# Patient Record
Sex: Male | Born: 2000
Health system: Southern US, Community
[De-identification: ages and names within clinical notes are randomized; demographics above are authoritative.]

## PROBLEM LIST (undated history)

## (undated) DIAGNOSIS — H4031X0 Glaucoma secondary to eye trauma, right eye, stage unspecified: Secondary | ICD-10-CM

## (undated) DIAGNOSIS — S52502A Unspecified fracture of the lower end of left radius, initial encounter for closed fracture: Secondary | ICD-10-CM

## (undated) HISTORY — PX: STRABISMUS SURGERY: SHX218

---
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
## 2018-03-10 DIAGNOSIS — D2262 Melanocytic nevi of left upper limb, including shoulder: Secondary | ICD-10-CM | POA: Diagnosis not present

## 2018-03-10 DIAGNOSIS — L700 Acne vulgaris: Secondary | ICD-10-CM | POA: Diagnosis not present

## 2018-03-10 DIAGNOSIS — D485 Neoplasm of uncertain behavior of skin: Secondary | ICD-10-CM | POA: Diagnosis not present

## 2018-03-10 DIAGNOSIS — L708 Other acne: Secondary | ICD-10-CM | POA: Diagnosis not present

## 2018-03-10 DIAGNOSIS — D225 Melanocytic nevi of trunk: Secondary | ICD-10-CM | POA: Diagnosis not present

## 2018-03-28 DIAGNOSIS — D229 Melanocytic nevi, unspecified: Secondary | ICD-10-CM | POA: Diagnosis not present

## 2018-04-09 DIAGNOSIS — F17200 Nicotine dependence, unspecified, uncomplicated: Secondary | ICD-10-CM | POA: Diagnosis not present

## 2018-04-09 DIAGNOSIS — F410 Panic disorder [episodic paroxysmal anxiety] without agoraphobia: Secondary | ICD-10-CM | POA: Diagnosis not present

## 2018-04-09 DIAGNOSIS — R079 Chest pain, unspecified: Secondary | ICD-10-CM | POA: Diagnosis not present

## 2018-04-09 DIAGNOSIS — F409 Phobic anxiety disorder, unspecified: Secondary | ICD-10-CM | POA: Diagnosis not present

## 2018-04-26 DIAGNOSIS — D225 Melanocytic nevi of trunk: Secondary | ICD-10-CM | POA: Diagnosis not present

## 2018-04-26 DIAGNOSIS — L988 Other specified disorders of the skin and subcutaneous tissue: Secondary | ICD-10-CM | POA: Diagnosis not present

## 2018-05-04 DIAGNOSIS — R319 Hematuria, unspecified: Secondary | ICD-10-CM | POA: Diagnosis not present

## 2018-05-04 DIAGNOSIS — F17200 Nicotine dependence, unspecified, uncomplicated: Secondary | ICD-10-CM | POA: Diagnosis not present

## 2018-05-04 DIAGNOSIS — R410 Disorientation, unspecified: Secondary | ICD-10-CM | POA: Diagnosis not present

## 2018-05-04 DIAGNOSIS — R945 Abnormal results of liver function studies: Secondary | ICD-10-CM | POA: Diagnosis not present

## 2018-05-04 DIAGNOSIS — Z79899 Other long term (current) drug therapy: Secondary | ICD-10-CM | POA: Diagnosis not present

## 2018-05-04 DIAGNOSIS — R51 Headache: Secondary | ICD-10-CM | POA: Diagnosis not present

## 2018-05-04 DIAGNOSIS — B2790 Infectious mononucleosis, unspecified without complication: Secondary | ICD-10-CM | POA: Diagnosis not present

## 2018-05-10 DIAGNOSIS — B2790 Infectious mononucleosis, unspecified without complication: Secondary | ICD-10-CM | POA: Diagnosis not present

## 2018-06-21 DIAGNOSIS — Q131 Absence of iris: Secondary | ICD-10-CM | POA: Diagnosis not present

## 2018-06-21 DIAGNOSIS — H4031X4 Glaucoma secondary to eye trauma, right eye, indeterminate stage: Secondary | ICD-10-CM | POA: Diagnosis not present

## 2018-06-21 DIAGNOSIS — Z961 Presence of intraocular lens: Secondary | ICD-10-CM | POA: Diagnosis not present

## 2018-06-27 DIAGNOSIS — S6991XA Unspecified injury of right wrist, hand and finger(s), initial encounter: Secondary | ICD-10-CM | POA: Diagnosis not present

## 2018-06-29 DIAGNOSIS — S62114A Nondisplaced fracture of triquetrum [cuneiform] bone, right wrist, initial encounter for closed fracture: Secondary | ICD-10-CM | POA: Diagnosis not present

## 2018-06-29 DIAGNOSIS — M25531 Pain in right wrist: Secondary | ICD-10-CM | POA: Diagnosis not present

## 2018-06-30 DIAGNOSIS — F330 Major depressive disorder, recurrent, mild: Secondary | ICD-10-CM | POA: Diagnosis not present

## 2018-07-20 DIAGNOSIS — Z4802 Encounter for removal of sutures: Secondary | ICD-10-CM | POA: Diagnosis not present

## 2018-08-01 DIAGNOSIS — S62111A Displaced fracture of triquetrum [cuneiform] bone, right wrist, initial encounter for closed fracture: Secondary | ICD-10-CM | POA: Diagnosis not present

## 2018-08-01 DIAGNOSIS — H4089 Other specified glaucoma: Secondary | ICD-10-CM | POA: Diagnosis not present

## 2018-08-01 DIAGNOSIS — M25531 Pain in right wrist: Secondary | ICD-10-CM | POA: Diagnosis not present

## 2018-08-18 DIAGNOSIS — R1011 Right upper quadrant pain: Secondary | ICD-10-CM | POA: Diagnosis not present

## 2018-09-19 DIAGNOSIS — Z6820 Body mass index (BMI) 20.0-20.9, adult: Secondary | ICD-10-CM | POA: Diagnosis not present

## 2018-09-19 DIAGNOSIS — R140 Abdominal distension (gaseous): Secondary | ICD-10-CM | POA: Diagnosis not present

## 2018-09-19 DIAGNOSIS — R100 Acute abdomen: Secondary | ICD-10-CM | POA: Diagnosis not present

## 2018-10-11 DIAGNOSIS — R140 Abdominal distension (gaseous): Secondary | ICD-10-CM | POA: Diagnosis not present

## 2018-10-11 DIAGNOSIS — Z13220 Encounter for screening for lipoid disorders: Secondary | ICD-10-CM | POA: Diagnosis not present

## 2018-10-11 DIAGNOSIS — R1011 Right upper quadrant pain: Secondary | ICD-10-CM | POA: Diagnosis not present

## 2018-10-11 DIAGNOSIS — Z131 Encounter for screening for diabetes mellitus: Secondary | ICD-10-CM | POA: Diagnosis not present

## 2018-10-18 DIAGNOSIS — I10 Essential (primary) hypertension: Secondary | ICD-10-CM | POA: Diagnosis not present

## 2018-10-18 DIAGNOSIS — E119 Type 2 diabetes mellitus without complications: Secondary | ICD-10-CM | POA: Diagnosis not present

## 2018-10-18 DIAGNOSIS — Z79899 Other long term (current) drug therapy: Secondary | ICD-10-CM | POA: Diagnosis not present

## 2018-10-18 DIAGNOSIS — E782 Mixed hyperlipidemia: Secondary | ICD-10-CM | POA: Diagnosis not present

## 2018-10-24 DIAGNOSIS — R1011 Right upper quadrant pain: Secondary | ICD-10-CM | POA: Diagnosis not present

## 2018-10-24 DIAGNOSIS — R142 Eructation: Secondary | ICD-10-CM | POA: Diagnosis not present

## 2018-10-24 DIAGNOSIS — R140 Abdominal distension (gaseous): Secondary | ICD-10-CM | POA: Diagnosis not present

## 2018-12-07 DIAGNOSIS — Z209 Contact with and (suspected) exposure to unspecified communicable disease: Secondary | ICD-10-CM | POA: Diagnosis not present

## 2018-12-07 DIAGNOSIS — Z20828 Contact with and (suspected) exposure to other viral communicable diseases: Secondary | ICD-10-CM | POA: Diagnosis not present

## 2019-01-02 DIAGNOSIS — R1011 Right upper quadrant pain: Secondary | ICD-10-CM | POA: Diagnosis not present

## 2019-01-02 DIAGNOSIS — R109 Unspecified abdominal pain: Secondary | ICD-10-CM | POA: Diagnosis not present

## 2019-01-09 DIAGNOSIS — R634 Abnormal weight loss: Secondary | ICD-10-CM | POA: Diagnosis not present

## 2019-01-09 DIAGNOSIS — R1011 Right upper quadrant pain: Secondary | ICD-10-CM | POA: Diagnosis not present

## 2019-01-09 DIAGNOSIS — R110 Nausea: Secondary | ICD-10-CM | POA: Diagnosis not present

## 2019-01-17 DIAGNOSIS — Z20828 Contact with and (suspected) exposure to other viral communicable diseases: Secondary | ICD-10-CM | POA: Diagnosis not present

## 2019-01-17 DIAGNOSIS — Z01818 Encounter for other preprocedural examination: Secondary | ICD-10-CM | POA: Diagnosis not present

## 2019-01-19 DIAGNOSIS — R1011 Right upper quadrant pain: Secondary | ICD-10-CM | POA: Diagnosis not present

## 2019-01-24 DIAGNOSIS — R1011 Right upper quadrant pain: Secondary | ICD-10-CM | POA: Diagnosis not present

## 2019-01-24 DIAGNOSIS — K2950 Unspecified chronic gastritis without bleeding: Secondary | ICD-10-CM | POA: Diagnosis not present

## 2019-01-24 DIAGNOSIS — B9681 Helicobacter pylori [H. pylori] as the cause of diseases classified elsewhere: Secondary | ICD-10-CM | POA: Diagnosis not present

## 2019-01-24 DIAGNOSIS — K2960 Other gastritis without bleeding: Secondary | ICD-10-CM | POA: Diagnosis not present

## 2019-01-24 DIAGNOSIS — R634 Abnormal weight loss: Secondary | ICD-10-CM | POA: Diagnosis not present

## 2019-02-23 DIAGNOSIS — S8001XA Contusion of right knee, initial encounter: Secondary | ICD-10-CM | POA: Diagnosis not present

## 2019-02-23 DIAGNOSIS — S50811A Abrasion of right forearm, initial encounter: Secondary | ICD-10-CM | POA: Diagnosis not present

## 2019-03-26 DIAGNOSIS — D485 Neoplasm of uncertain behavior of skin: Secondary | ICD-10-CM | POA: Diagnosis not present

## 2019-03-26 DIAGNOSIS — D225 Melanocytic nevi of trunk: Secondary | ICD-10-CM | POA: Diagnosis not present

## 2019-03-26 DIAGNOSIS — L820 Inflamed seborrheic keratosis: Secondary | ICD-10-CM | POA: Diagnosis not present

## 2019-03-26 DIAGNOSIS — Z872 Personal history of diseases of the skin and subcutaneous tissue: Secondary | ICD-10-CM | POA: Diagnosis not present

## 2019-04-16 DIAGNOSIS — R1011 Right upper quadrant pain: Secondary | ICD-10-CM | POA: Diagnosis not present

## 2019-04-16 DIAGNOSIS — R634 Abnormal weight loss: Secondary | ICD-10-CM | POA: Diagnosis not present

## 2019-04-16 DIAGNOSIS — R110 Nausea: Secondary | ICD-10-CM | POA: Diagnosis not present

## 2019-04-16 DIAGNOSIS — B9681 Helicobacter pylori [H. pylori] as the cause of diseases classified elsewhere: Secondary | ICD-10-CM | POA: Diagnosis not present

## 2019-05-23 DIAGNOSIS — L03012 Cellulitis of left finger: Secondary | ICD-10-CM | POA: Diagnosis not present

## 2019-05-30 ENCOUNTER — Ambulatory Visit: Payer: BC Managed Care – PPO | Attending: Internal Medicine

## 2019-05-30 ENCOUNTER — Other Ambulatory Visit: Payer: Self-pay

## 2019-05-30 DIAGNOSIS — Z20828 Contact with and (suspected) exposure to other viral communicable diseases: Secondary | ICD-10-CM | POA: Insufficient documentation

## 2019-05-30 DIAGNOSIS — Z20822 Contact with and (suspected) exposure to covid-19: Secondary | ICD-10-CM

## 2019-05-31 LAB — NOVEL CORONAVIRUS, NAA: SARS-CoV-2, NAA: NOT DETECTED

## 2019-05-31 NOTE — Progress Notes (Signed)
Order(s) created erroneously. Erroneous order ID: [id number]  Order moved by: Katlyne Nishida M  Order move date/time: 05/31/2019 1:10 PM  Source Patient: [name]  Source Contact: 05/30/2019  Destination Patient: [name]  Destination Contact: 05/30/2019 

## 2019-05-31 NOTE — Progress Notes (Signed)
Moving orders to this encounter.  

## 2019-05-31 NOTE — Progress Notes (Signed)
Order(s) created erroneously. Erroneous order ID: [id number]  Order moved by: Brigitte Pulse  Order move date/time: 05/31/2019 1:10 PM  Source Patient: [id number]  Source Contact: 05/30/2019  Destination Patient: [id number]  Destination Contact: 05/30/2019

## 2019-06-05 ENCOUNTER — Telehealth: Payer: Self-pay | Admitting: *Deleted

## 2019-06-05 NOTE — Telephone Encounter (Signed)
He called in requesting his COVID-19 test result.   I let him know it was not detected meaning he did not have the virus. He thanked me for my help. 

## 2020-06-17 ENCOUNTER — Other Ambulatory Visit: Payer: Self-pay

## 2020-06-17 DIAGNOSIS — Z20822 Contact with and (suspected) exposure to covid-19: Secondary | ICD-10-CM

## 2020-06-19 LAB — NOVEL CORONAVIRUS, NAA: SARS-CoV-2, NAA: NOT DETECTED

## 2020-06-19 LAB — SARS-COV-2, NAA 2 DAY TAT

## 2021-05-30 ENCOUNTER — Other Ambulatory Visit: Payer: Self-pay

## 2021-05-30 ENCOUNTER — Ambulatory Visit (HOSPITAL_COMMUNITY)
Admission: EM | Admit: 2021-05-30 | Discharge: 2021-05-30 | Disposition: A | Discharge status: Home / Self Care | Payer: 59 | Attending: Family Medicine | Admitting: Family Medicine

## 2021-05-30 ENCOUNTER — Encounter (HOSPITAL_COMMUNITY): Payer: Self-pay | Admitting: Emergency Medicine

## 2021-05-30 DIAGNOSIS — J069 Acute upper respiratory infection, unspecified: Secondary | ICD-10-CM | POA: Diagnosis not present

## 2021-05-30 DIAGNOSIS — R062 Wheezing: Secondary | ICD-10-CM

## 2021-05-30 MED ORDER — PROMETHAZINE-DM 6.25-15 MG/5ML PO SYRP: 5.0000 mL | ORAL_SOLUTION | Freq: Four times a day (QID) | ORAL | 0 refills | Status: DC | PRN

## 2021-05-30 MED ORDER — PREDNISONE 20 MG PO TABS: 40.0000 mg | ORAL_TABLET | Freq: Every day | ORAL | 0 refills | Status: DC

## 2021-05-30 NOTE — ED Triage Notes (Signed)
Coughing 4-5 days ago.  Complains of chest congestion.  Phlegm is green, blowing nose results in green sinus drainage.    A month ago had similar symptoms and treated with mucinex and did improve.

## 2021-06-01 NOTE — ED Provider Notes (Signed)
[facility]   [id number] 05/30/21 Arrival Time: 1446  ASSESSMENT & PLAN:  1. Viral URI with cough   2. Wheezing    Declines chest imaging today. Discussed typical duration of viral illnesses. Should be improving over the next few days. Discussed. OTC symptom care as needed.  Discharge Medication List as of 05/30/2021  4:10 PM     START taking these medications   Details  predniSONE (DELTASONE) 20 MG tablet Take 2 tablets (40 mg total) by mouth daily., Starting Sat 05/30/2021, Normal    promethazine-dextromethorphan (PROMETHAZINE-DM) 6.25-15 MG/5ML syrup Take 5 mLs by mouth 4 (four) times daily as needed for cough., Starting Sat 05/30/2021, Normal         Follow-up Information     Tally Joe, MD.   Specialty: Family Medicine Why: If worsening or failing to improve as anticipated. Contact information: [address] [place] [address] [phone]                 Reviewed expectations re: course of current medical issues. Questions answered. Outlined signs and symptoms indicating need for more acute intervention. Understanding verbalized. After Visit Summary given.   SUBJECTIVE: History from: patient. [name] [name] is a 20 y.o. male who reports: coughing, chest congestion, runny nose; abrupt onset; x 4-5 d; cough bothering him the most. Denies: fever and difficulty breathing. Normal PO intake without n/v/d.  OBJECTIVE:  Vitals:   05/30/21 1550  BP: 120/82  Pulse: 76  Resp: 18  Temp: 98.2 F (36.8 C)  TempSrc: Oral  SpO2: 98%    General appearance: alert; no distress Eyes: PERRLA; EOMI; conjunctiva normal HENT: [place]; AT; with nasal congestion Neck: supple  Lungs: speaks full sentences without difficulty; unlabored; bilat exp wheezing present Extremities: no edema Skin: warm and dry Neurologic: normal gait Psychological: alert and cooperative; normal mood and affect    No Known Allergies  Past Medical History:   Diagnosis Date   Distal radius fracture, left 04/03/2015   Traumatic glaucoma, right eye    Social History   Socioeconomic History   Marital status: Single    Spouse name: Not on file   Number of children: Not on file   Years of education: Not on file   Highest education level: Not on file  Occupational History   Not on file  Tobacco Use   Smoking status: Never   Smokeless tobacco: Never  Vaping Use   Vaping Use: Every day  Substance and Sexual Activity   Alcohol use: Yes   Drug use: No   Sexual activity: Not on file  Other Topics Concern   Not on file  Social History Narrative   Not on file   Social Determinants of Health   Financial Resource Strain: Not on file  Food Insecurity: Not on file  Transportation Needs: Not on file  Physical Activity: Not on file  Stress: Not on file  Social Connections: Not on file  Intimate Partner Violence: Not on file   History reviewed. No pertinent family history. Past Surgical History:  Procedure Laterality Date   EYE EXAMINATION UNDER ANESTHESIA Right 07/2010   EYE SURGERY Right 10/30/2010   revision of Ahmed valve   INSERTION OF AHMED VALVE Right 10/02/2010   INTRAOCULAR LENS REMOVAL Right 10/02/2010   with synechiolysis   OPEN REDUCTION INTERNAL FIXATION (ORIF) DISTAL RADIAL FRACTURE Left 04/10/2015   Procedure: OPEN REDUCTION INTERNAL FIXATION (ORIF) LEFT DISTAL RADIUS ;  Surgeon: Cindee Salt, MD;  Location: [facility];  Service: Orthopedics;  Laterality: Left;   STRABISMUS SURGERY Right 05/19/2011; 05/17/2012   VITRECTOMY AND CATARACT Right 10/02/2010     Mardella Layman, MD 06/01/21 1053

## 2021-06-09 ENCOUNTER — Ambulatory Visit (INDEPENDENT_AMBULATORY_CARE_PROVIDER_SITE_OTHER): Payer: 59

## 2021-06-09 ENCOUNTER — Other Ambulatory Visit: Payer: Self-pay

## 2021-06-09 ENCOUNTER — Encounter (HOSPITAL_COMMUNITY): Payer: Self-pay | Admitting: Emergency Medicine

## 2021-06-09 ENCOUNTER — Ambulatory Visit (HOSPITAL_COMMUNITY)
Admission: EM | Admit: 2021-06-09 | Discharge: 2021-06-09 | Disposition: A | Discharge status: Home / Self Care | Payer: 59 | Attending: Student | Admitting: Student

## 2021-06-09 DIAGNOSIS — R059 Cough, unspecified: Secondary | ICD-10-CM

## 2021-06-09 DIAGNOSIS — J40 Bronchitis, not specified as acute or chronic: Secondary | ICD-10-CM | POA: Diagnosis not present

## 2021-06-09 DIAGNOSIS — J329 Chronic sinusitis, unspecified: Secondary | ICD-10-CM | POA: Diagnosis not present

## 2021-06-09 MED ORDER — ALBUTEROL SULFATE HFA 108 (90 BASE) MCG/ACT IN AERS: 1.0000 | INHALATION_SPRAY | Freq: Four times a day (QID) | RESPIRATORY_TRACT | 0 refills | Status: DC | PRN

## 2021-06-09 MED ORDER — AMOXICILLIN-POT CLAVULANATE 875-125 MG PO TABS: 1.0000 | ORAL_TABLET | Freq: Two times a day (BID) | ORAL | 0 refills | Status: DC

## 2021-06-09 NOTE — Discharge Instructions (Addendum)
-  Start the antibiotic-Augmentin (amoxicillin-clavulanate), 1 pill every 12 hours for 7 days.  You can take this with food like with breakfast and dinner. -Albuterol inhaler as needed for cough, wheezing, shortness of breath, 1 to 2 puffs every 6 hours as needed. -You can take Tylenol up to 1000 mg 3 times daily, and ibuprofen up to 600 mg 3 times daily with food.  You can take these together, or alternate every 3-4 hours.

## 2021-06-09 NOTE — ED Triage Notes (Signed)
Pt is present today with a cough and chest congestion. Pt states sx started x2 weeks ago.

## 2021-06-09 NOTE — ED Provider Notes (Signed)
[facility]    CSN: [id number] Arrival date & time: 06/09/21  1121      History   Chief Complaint Chief Complaint  Patient presents with   Cough    Chest congestion    HPI [name] [name] is a 20 y.o. male presenting with continued cough.  Medical history pneumonia 2021.  We last saw him 05/30/2021, at that time managed his viral syndrome with prednisone and Promethazine DM. Did not check a covid or influenza test. Denies history of cardiopulmonary disease like asthma. Today describes hacking cough productive of yellow sputum, denies shortness of breath, chest pain, dizziness, fever/chills.  Does endorse worsening nasal congestion and some pressure behind the nose, without postnasal drip, ear pain.  HPI  Past Medical History:  Diagnosis Date   Distal radius fracture, left 04/03/2015   Traumatic glaucoma, right eye     There are no problems to display for this patient.   Past Surgical History:  Procedure Laterality Date   EYE EXAMINATION UNDER ANESTHESIA Right 07/2010   EYE SURGERY Right 10/30/2010   revision of Ahmed valve   INSERTION OF AHMED VALVE Right 10/02/2010   INTRAOCULAR LENS REMOVAL Right 10/02/2010   with synechiolysis   OPEN REDUCTION INTERNAL FIXATION (ORIF) DISTAL RADIAL FRACTURE Left 04/10/2015   Procedure: OPEN REDUCTION INTERNAL FIXATION (ORIF) LEFT DISTAL RADIUS ;  Surgeon: Daryll Brod, MD;  Location: [place];  Service: Orthopedics;  Laterality: Left;   STRABISMUS SURGERY Right 05/19/2011; 05/17/2012   VITRECTOMY AND CATARACT Right 10/02/2010       Home Medications    Prior to Admission medications   Medication Sig Start Date End Date Taking? Authorizing Provider  albuterol (VENTOLIN HFA) 108 (90 Base) MCG/ACT inhaler Inhale 1-2 puffs into the lungs every 6 (six) hours as needed for wheezing or shortness of breath. 06/09/21  Yes Hazel Sams, PA-C  amoxicillin-clavulanate (AUGMENTIN) 875-125 MG tablet Take 1 tablet by mouth  every 12 (twelve) hours. 06/09/21  Yes Hazel Sams, PA-C  ibuprofen (ADVIL,MOTRIN) 200 MG tablet Take 200 mg by mouth every 6 (six) hours as needed.    [provider]  predniSONE (DELTASONE) 20 MG tablet Take 2 tablets (40 mg total) by mouth daily. 05/30/21   Vanessa Kick, MD  promethazine-dextromethorphan (PROMETHAZINE-DM) 6.25-15 MG/5ML syrup Take 5 mLs by mouth 4 (four) times daily as needed for cough. 05/30/21   Vanessa Kick, MD  timolol (BETIMOL) 0.25 % ophthalmic solution Place 1 drop into the right eye 2 (two) times daily.     [provider]    Family History History reviewed. No pertinent family history.  Social History Social History   Tobacco Use   Smoking status: Never   Smokeless tobacco: Never  Vaping Use   Vaping Use: Every day  Substance Use Topics   Alcohol use: Yes   Drug use: No     Allergies   Patient has no known allergies.   Review of Systems Review of Systems  Constitutional:  Negative for appetite change, chills and fever.  HENT:  Positive for congestion. Negative for ear pain, rhinorrhea, sinus pressure, sinus pain and sore throat.   Eyes:  Negative for redness and visual disturbance.  Respiratory:  Positive for cough. Negative for chest tightness, shortness of breath and wheezing.   Cardiovascular:  Negative for chest pain and palpitations.  Gastrointestinal:  Negative for abdominal pain, constipation, diarrhea, nausea and vomiting.  Genitourinary:  Negative for dysuria, frequency and urgency.  Musculoskeletal:  Negative for myalgias.  Neurological:  Negative for dizziness, weakness and headaches.  Psychiatric/Behavioral:  Negative for confusion.   All other systems reviewed and are negative.   Physical Exam Triage Vital Signs ED Triage Vitals  Enc Vitals Group     BP      Pulse      Resp      Temp      Temp src      SpO2      Weight      Height      Head Circumference      Peak Flow      Pain Score      Pain  Loc      Pain Edu?      Excl. in GC?    No data found.  Updated Vital Signs BP 113/77    Pulse (!) 102    Temp 99 F (37.2 C) (Oral)    Resp 18    SpO2 95%   Visual Acuity Right Eye Distance:   Left Eye Distance:   Bilateral Distance:    Right Eye Near:   Left Eye Near:    Bilateral Near:     Physical Exam Vitals reviewed.  Constitutional:      General: He is not in acute distress.    Appearance: Normal appearance. He is not ill-appearing.  HENT:     Head: Normocephalic and atraumatic.     Right Ear: Tympanic membrane, ear canal and external ear normal. No tenderness. No middle ear effusion. There is no impacted cerumen. Tympanic membrane is not perforated, erythematous, retracted or bulging.     Left Ear: Tympanic membrane, ear canal and external ear normal. No tenderness.  No middle ear effusion. There is no impacted cerumen. Tympanic membrane is not perforated, erythematous, retracted or bulging.     Nose: No congestion.     Right Sinus: Maxillary sinus tenderness present. No frontal sinus tenderness.     Left Sinus: Maxillary sinus tenderness present. No frontal sinus tenderness.     Mouth/Throat:     Mouth: Mucous membranes are moist.     Pharynx: Uvula midline. No oropharyngeal exudate or posterior oropharyngeal erythema.  Eyes:     Extraocular Movements: Extraocular movements intact.     Pupils: Pupils are equal, round, and reactive to light.  Cardiovascular:     Rate and Rhythm: Regular rhythm. Tachycardia present.     Heart sounds: Normal heart sounds.  Pulmonary:     Effort: Pulmonary effort is normal.     Breath sounds: Normal breath sounds. No decreased breath sounds, wheezing, rhonchi or rales.  Abdominal:     Palpations: Abdomen is soft.     Tenderness: There is no abdominal tenderness. There is no guarding or rebound.  Lymphadenopathy:     Cervical: No cervical adenopathy.     Right cervical: No superficial cervical adenopathy.    Left cervical: No  superficial cervical adenopathy.  Neurological:     General: No focal deficit present.     Mental Status: He is alert and oriented to person, place, and time.  Psychiatric:        Mood and Affect: Mood normal.        Behavior: Behavior normal.        Thought Content: Thought content normal.        Judgment: Judgment normal.     UC Treatments / Results  Labs (all labs ordered are listed, but only abnormal results are displayed)  Labs Reviewed - No data to display  EKG   Radiology DG Chest 2 View  Result Date: 06/09/2021 CLINICAL DATA:  Cough EXAM: CHEST - 2 VIEW COMPARISON:  None. FINDINGS: The heart size and mediastinal contours are within normal limits. Both lungs are clear. The visualized skeletal structures are unremarkable. IMPRESSION: No active cardiopulmonary disease. Electronically Signed   By: Elmer Picker M.D.   On: 06/09/2021 13:01    Procedures Procedures (including critical care time)  Medications Ordered in UC Medications - No data to display  Initial Impression / Assessment and Plan / UC Course  I have reviewed the triage vital signs and the nursing notes.  Pertinent labs & imaging results that were available during my care of the patient were reviewed by me and considered in my medical decision making (see chart for details).     This patient is a very pleasant 20 y.o. year old male presenting with sinusitis following viral URI x2 weeks. Today this pt is tachy but afebrile nontachypneic, oxygenating well on room air, no wheezes rhonchi or rales.   Given duration of symptoms and tachycardia. Did check a chest x-ray - No active cardiopulmonary disease.  Worsening purulent nasal congestion and facial pressure following viral URI x2 weeks. Will manage as sinusitis with augmentin as below. Also sent albuterol for continued cough.   ED return precautions discussed. Patient verbalizes understanding and agreement.   Coding Level 4 for acute illness with  systemic symptoms, and prescription drug management   Final Clinical Impressions(s) / UC Diagnoses   Final diagnoses:  Sinobronchitis     Discharge Instructions      -Start the antibiotic-Augmentin (amoxicillin-clavulanate), 1 pill every 12 hours for 7 days.  You can take this with food like with breakfast and dinner. -Albuterol inhaler as needed for cough, wheezing, shortness of breath, 1 to 2 puffs every 6 hours as needed. -You can take Tylenol up to 1000 mg 3 times daily, and ibuprofen up to 600 mg 3 times daily with food.  You can take these together, or alternate every 3-4 hours.      ED Prescriptions     Medication Sig Dispense Auth. Provider   amoxicillin-clavulanate (AUGMENTIN) 875-125 MG tablet Take 1 tablet by mouth every 12 (twelve) hours. 14 tablet Hazel Sams, PA-C   albuterol (VENTOLIN HFA) 108 (90 Base) MCG/ACT inhaler Inhale 1-2 puffs into the lungs every 6 (six) hours as needed for wheezing or shortness of breath. 1 each Hazel Sams, PA-C      PDMP not reviewed this encounter.   Hazel Sams, PA-C 06/09/21 1318

## 2022-02-05 DIAGNOSIS — H4031X1 Glaucoma secondary to eye trauma, right eye, mild stage: Secondary | ICD-10-CM | POA: Diagnosis not present

## 2022-02-16 DIAGNOSIS — H47323 Drusen of optic disc, bilateral: Secondary | ICD-10-CM | POA: Diagnosis not present

## 2022-02-16 DIAGNOSIS — H4031X4 Glaucoma secondary to eye trauma, right eye, indeterminate stage: Secondary | ICD-10-CM | POA: Diagnosis not present

## 2022-04-01 DIAGNOSIS — R69 Illness, unspecified: Secondary | ICD-10-CM | POA: Diagnosis not present

## 2022-04-01 DIAGNOSIS — F39 Unspecified mood [affective] disorder: Secondary | ICD-10-CM | POA: Diagnosis not present

## 2022-04-01 DIAGNOSIS — F411 Generalized anxiety disorder: Secondary | ICD-10-CM | POA: Diagnosis not present

## 2022-04-16 DIAGNOSIS — F39 Unspecified mood [affective] disorder: Secondary | ICD-10-CM | POA: Diagnosis not present

## 2022-04-16 DIAGNOSIS — F411 Generalized anxiety disorder: Secondary | ICD-10-CM | POA: Diagnosis not present

## 2022-04-16 DIAGNOSIS — R69 Illness, unspecified: Secondary | ICD-10-CM | POA: Diagnosis not present

## 2022-04-16 DIAGNOSIS — F902 Attention-deficit hyperactivity disorder, combined type: Secondary | ICD-10-CM | POA: Diagnosis not present

## 2022-04-30 DIAGNOSIS — F902 Attention-deficit hyperactivity disorder, combined type: Secondary | ICD-10-CM | POA: Diagnosis not present

## 2022-04-30 DIAGNOSIS — F411 Generalized anxiety disorder: Secondary | ICD-10-CM | POA: Diagnosis not present

## 2022-04-30 DIAGNOSIS — R69 Illness, unspecified: Secondary | ICD-10-CM | POA: Diagnosis not present

## 2022-06-02 DIAGNOSIS — F902 Attention-deficit hyperactivity disorder, combined type: Secondary | ICD-10-CM | POA: Diagnosis not present

## 2022-06-02 DIAGNOSIS — F39 Unspecified mood [affective] disorder: Secondary | ICD-10-CM | POA: Diagnosis not present

## 2022-06-02 DIAGNOSIS — R69 Illness, unspecified: Secondary | ICD-10-CM | POA: Diagnosis not present

## 2022-06-02 DIAGNOSIS — F411 Generalized anxiety disorder: Secondary | ICD-10-CM | POA: Diagnosis not present

## 2022-08-25 IMAGING — DX DG CHEST 2V
3 series · 3 of 3 positions shown · non-contrast
Comparison: None.

CLINICAL DATA: Cough

EXAM:
CHEST - 2 VIEW

[chest pa (1 of 2)]
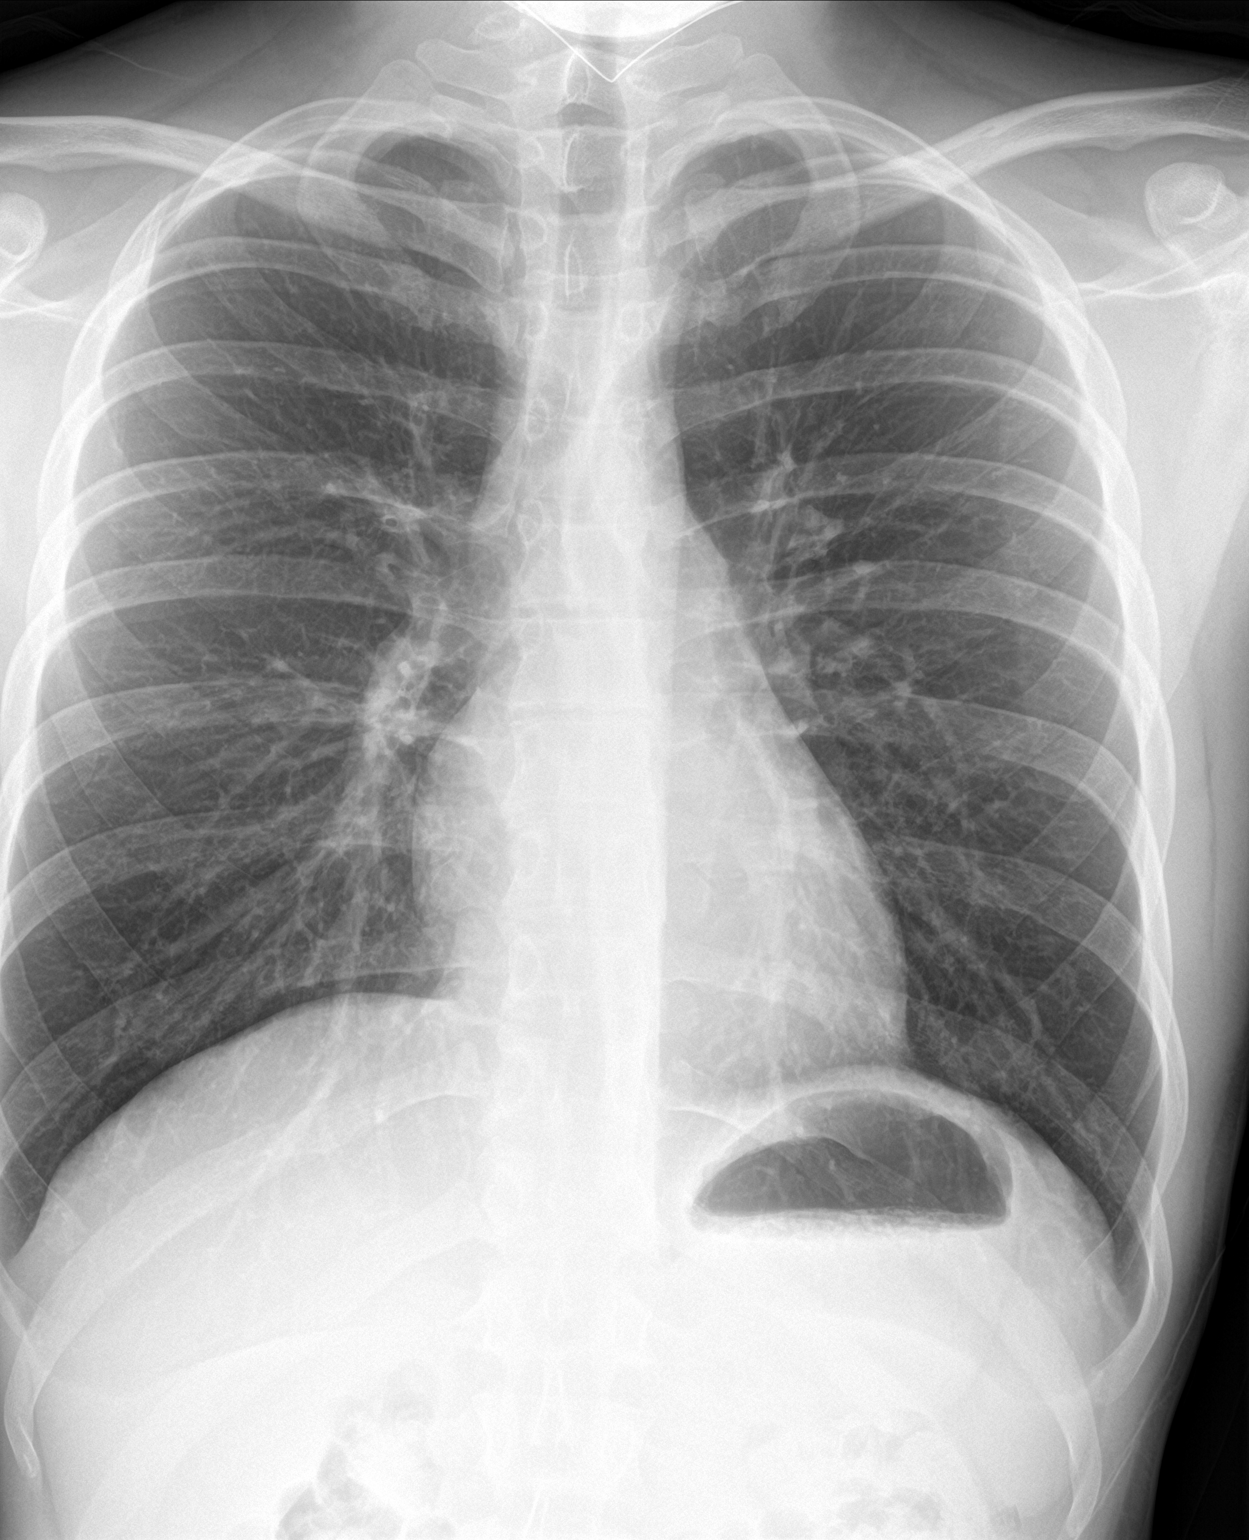

[chest lat]
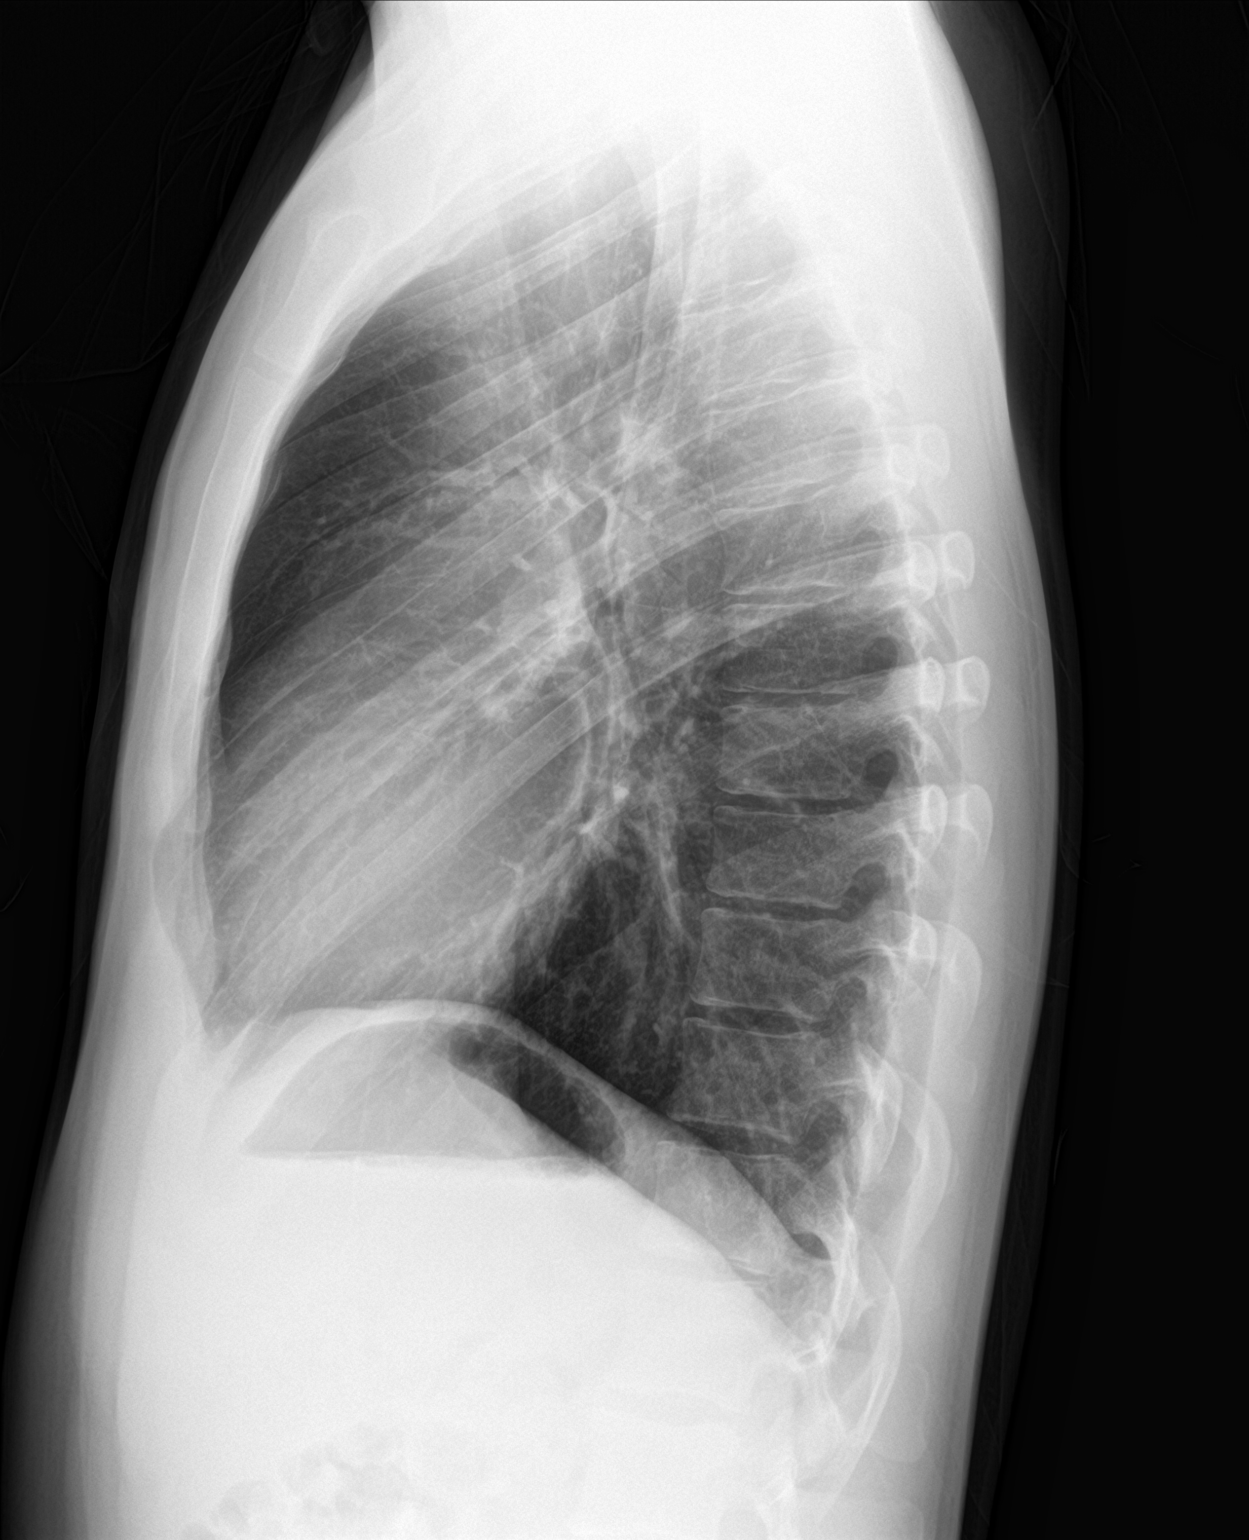

[chest pa (2 of 2)]
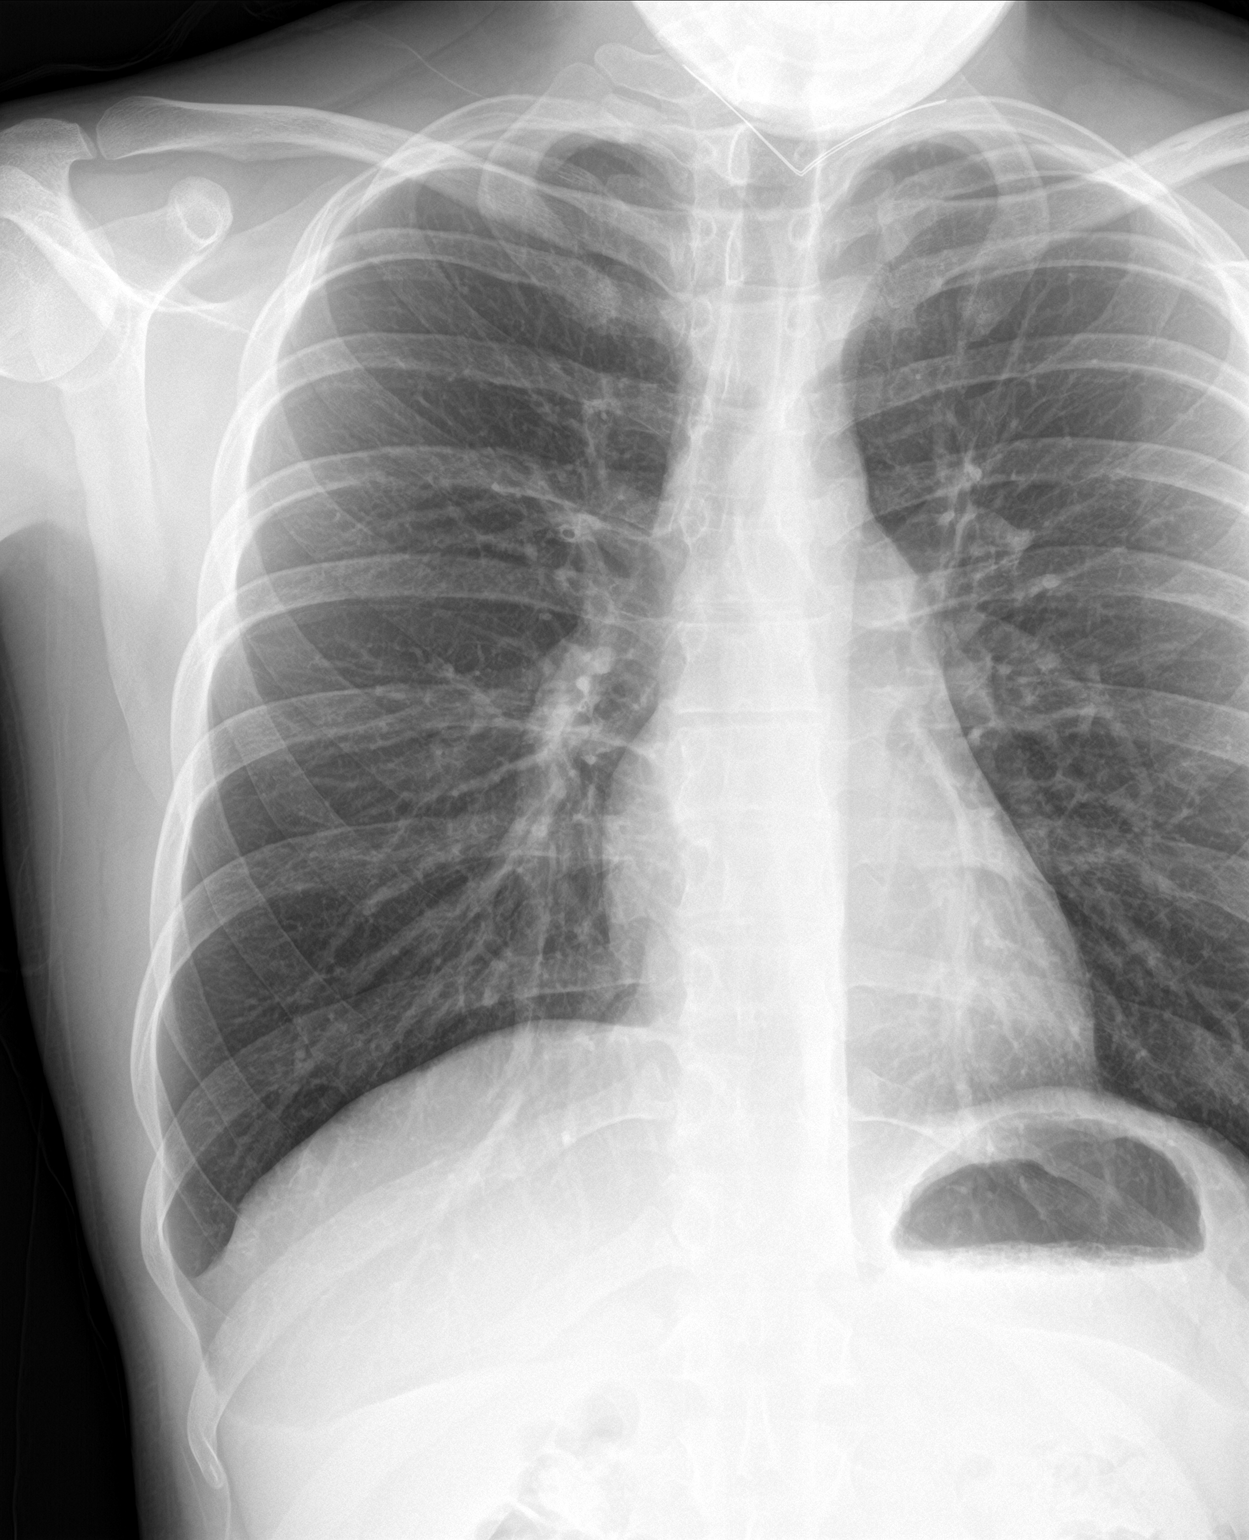

[3 of 3 positions shown; findings below may reference images not displayed]

FINDINGS: The heart size and mediastinal contours are within normal limits.
Both lungs are clear. The visualized skeletal structures are
unremarkable.
IMPRESSION: No active cardiopulmonary disease.

## 2024-05-20 ENCOUNTER — Encounter (HOSPITAL_COMMUNITY): Payer: Self-pay | Admitting: Emergency Medicine

## 2024-05-20 ENCOUNTER — Ambulatory Visit (HOSPITAL_COMMUNITY): Admission: EM | Admit: 2024-05-20 | Discharge: 2024-05-20 | Disposition: A | Discharge status: Home / Self Care

## 2024-05-20 DIAGNOSIS — J069 Acute upper respiratory infection, unspecified: Secondary | ICD-10-CM | POA: Diagnosis not present

## 2024-05-20 MED ORDER — BENZONATATE 100 MG PO CAPS: 100.0000 mg | ORAL_CAPSULE | Freq: Three times a day (TID) | ORAL | 0 refills | Status: AC

## 2024-05-20 MED ORDER — CETIRIZINE HCL 10 MG PO TABS: 10.0000 mg | ORAL_TABLET | Freq: Every day | ORAL | 2 refills | Status: AC

## 2024-05-20 NOTE — Discharge Instructions (Addendum)
 You have a viral illness which will improve on its own with rest, fluids, and medications to help with your symptoms.  Tylenol , guaifenesin (plain mucinex), and saline nasal sprays may help relieve symptoms.   Zyrtec  10mg  daily at bedtime to help with post nasal drip and cough.  Tessalon  perles every 8 hours as needed for cough.   Two teaspoons of honey in 1 cup of warm water every 4-6 hours may help with throat pains.  Humidifier in room at nighttime may help soothe cough (clean well daily).   For chest pain, shortness of breath, inability to keep food or fluids down without vomiting, fever that does not respond to tylenol  or motrin, or any other severe symptoms, please go to the ER for further evaluation. Return to urgent care as needed, otherwise follow-up with PCP.

## 2024-05-20 NOTE — ED Provider Notes (Signed)
 [facility]    CSN: [id number] Arrival date & time: 05/20/24  1059      History   Chief Complaint Chief Complaint  Patient presents with   Appointment    HPI [name] [name] is a 23 y.o. male.   [name] [name] is a 23 y.o. male presenting for chief complaint of cough, nasal congestion, and generalized fatigue that initially started 10 days ago.  Symptoms have improved significantly since onset 10 days ago and cough has been persistent.  Patient states he felt 90% better a few days ago, then experienced worsening persistent annoying cough the next day throughout the day.  Cough is productive with mostly clear sputum but sputum is sometimes streaked with yellow.  Nasal drainage is clear.  He denies shortness of breath, chest pain, heart palpitations, nausea, vomiting, diarrhea, abdominal pain, and rashes.  Denies recent fever, chills or recent sick contacts with similar symptoms.  Former cigarette smoker, smokes marijuana occasionally.  Denies history of asthma or COPD/other chronic respiratory problems.  He is taking Mucinex with relief of symptoms.  Cough is no longer keeping him awake at night and he is generally feeling much better today     Past Medical History:  Diagnosis Date   Distal radius fracture, left 04/03/2015   Traumatic glaucoma, right eye     There are no active problems to display for this patient.   Past Surgical History:  Procedure Laterality Date   EYE EXAMINATION UNDER ANESTHESIA Right 07/2010   EYE SURGERY Right 10/30/2010   revision of Ahmed valve   INSERTION OF AHMED VALVE Right 10/02/2010   INTRAOCULAR LENS REMOVAL Right 10/02/2010   with synechiolysis   OPEN REDUCTION INTERNAL FIXATION (ORIF) DISTAL RADIAL FRACTURE Left 04/10/2015   Procedure: OPEN REDUCTION INTERNAL FIXATION (ORIF) LEFT DISTAL RADIUS ;  Surgeon: Arley Curia, MD;  Location: [facility];  Service: Orthopedics;  Laterality: Left;   STRABISMUS SURGERY Right 05/19/2011;  05/17/2012   VITRECTOMY AND CATARACT Right 10/02/2010       Home Medications    Prior to Admission medications   Medication Sig Start Date End Date Taking? Authorizing Provider  benzonatate  (TESSALON ) 100 MG capsule Take 1 capsule (100 mg total) by mouth every 8 (eight) hours. 05/20/24  Yes Enedelia Dorna HERO, FNP  cetirizine  (ZYRTEC ) 10 MG tablet Take 1 tablet (10 mg total) by mouth daily. 05/20/24  Yes Enedelia Dorna HERO, FNP  methylphenidate 27 MG PO CR tablet Take 27 mg by mouth daily. 04/16/24  Yes [provider]  timolol (BETIMOL) 0.25 % ophthalmic solution Place 1 drop into the right eye 2 (two) times daily.     [provider]  timolol (TIMOPTIC) 0.5 % ophthalmic solution Place 1 drop into the right eye 2 (two) times daily.    [provider]    Family History No family history on file.  Social History Social History   Tobacco Use   Smoking status: Never   Smokeless tobacco: Never  Vaping Use   Vaping status: Every Day  Substance Use Topics   Alcohol use: Yes   Drug use: No     Allergies   Patient has no known allergies.   Review of Systems Review of Systems Per HPI  Physical Exam Triage Vital Signs ED Triage Vitals [05/20/24 1131]  Encounter Vitals Group     BP 113/74     Girls Systolic BP Percentile      Girls Diastolic BP Percentile  Boys Systolic BP Percentile      Boys Diastolic BP Percentile      Pulse Rate 85     Resp 14     Temp 97.9 F (36.6 C)     Temp Source Oral     SpO2 97 %     Weight      Height      Head Circumference      Peak Flow      Pain Score 0     Pain Loc      Pain Education      Exclude from Growth Chart    No data found.  Updated Vital Signs BP 113/74 (BP Location: Right Arm)   Pulse 85   Temp 97.9 F (36.6 C) (Oral)   Resp 14   SpO2 97%   Visual Acuity Right Eye Distance:   Left Eye Distance:   Bilateral Distance:    Right Eye Near:   Left Eye Near:    Bilateral  Near:     Physical Exam Vitals and nursing note reviewed.  Constitutional:      Appearance: He is not ill-appearing or toxic-appearing.  HENT:     Head: Normocephalic and atraumatic.     Right Ear: Hearing, tympanic membrane, ear canal and external ear normal.     Left Ear: Hearing, tympanic membrane, ear canal and external ear normal.     Nose: Nose normal.     Mouth/Throat:     Lips: Pink.     Mouth: Mucous membranes are moist. No injury or oral lesions.     Dentition: Normal dentition.     Tongue: No lesions.     Pharynx: Oropharynx is clear. Uvula midline. No pharyngeal swelling, oropharyngeal exudate, posterior oropharyngeal erythema, uvula swelling or postnasal drip.     Tonsils: No tonsillar exudate.     Comments: Streaky clear postnasal drainage to the posterior oropharynx with minimal erythema. Eyes:     General: Lids are normal. Vision grossly intact. Gaze aligned appropriately.     Extraocular Movements: Extraocular movements intact.     Conjunctiva/sclera: Conjunctivae normal.  Neck:     Trachea: Trachea and phonation normal.  Cardiovascular:     Rate and Rhythm: Normal rate and regular rhythm.     Heart sounds: Normal heart sounds, S1 normal and S2 normal.  Pulmonary:     Effort: Pulmonary effort is normal. No respiratory distress.     Breath sounds: Normal breath sounds and air entry. No wheezing, rhonchi or rales.  Chest:     Chest wall: No tenderness.  Musculoskeletal:     Cervical back: Neck supple.  Lymphadenopathy:     Cervical: No cervical adenopathy.  Skin:    General: Skin is warm and dry.     Capillary Refill: Capillary refill takes less than 2 seconds.     Findings: No rash.  Neurological:     General: No focal deficit present.     Mental Status: He is alert and oriented to person, place, and time. Mental status is at baseline.     Cranial Nerves: No dysarthria or facial asymmetry.  Psychiatric:        Mood and Affect: Mood normal.        Speech:  Speech normal.        Behavior: Behavior normal.        Thought Content: Thought content normal.        Judgment: Judgment normal.      UC  Treatments / Results  Labs (all labs ordered are listed, but only abnormal results are displayed) Labs Reviewed - No data to display  EKG   Radiology No results found.  Procedures Procedures (including critical care time)  Medications Ordered in UC Medications - No data to display  Initial Impression / Assessment and Plan / UC Course  I have reviewed the triage vital signs and the nursing notes.  Pertinent labs & imaging results that were available during my care of the patient were reviewed by me and considered in my medical decision making (see chart for details).   1.  Viral URI with cough Suspect viral URI, viral syndrome.  Strep/viral testing: deferred given timing of illness, delayed presentation to clinic.   Physical exam findings reassuring, vital signs hemodynamically stable, and lungs clear, therefore deferred imaging of the chest.  Advised supportive care/prescriptions for symptomatic relief as outlined in AVS.    Counseled patient on potential for adverse effects with medications prescribed/recommended today, strict ER and return-to-clinic precautions discussed, patient verbalized understanding.    Final Clinical Impressions(s) / UC Diagnoses   Final diagnoses:  Viral URI with cough     Discharge Instructions      You have a viral illness which will improve on its own with rest, fluids, and medications to help with your symptoms.  Tylenol , guaifenesin (plain mucinex), and saline nasal sprays may help relieve symptoms.   Zyrtec  10mg  daily at bedtime to help with post nasal drip and cough.  Tessalon  perles every 8 hours as needed for cough.   Two teaspoons of honey in 1 cup of warm water every 4-6 hours may help with throat pains.  Humidifier in room at nighttime may help soothe cough (clean well daily).    For chest pain, shortness of breath, inability to keep food or fluids down without vomiting, fever that does not respond to tylenol  or motrin, or any other severe symptoms, please go to the ER for further evaluation. Return to urgent care as needed, otherwise follow-up with PCP.       ED Prescriptions     Medication Sig Dispense Auth. Provider   benzonatate  (TESSALON ) 100 MG capsule Take 1 capsule (100 mg total) by mouth every 8 (eight) hours. 21 capsule Enedelia Going M, FNP   cetirizine  (ZYRTEC ) 10 MG tablet Take 1 tablet (10 mg total) by mouth daily. 30 tablet Enedelia Going HERO, FNP      PDMP not reviewed this encounter.   Enedelia Going HERO, [place] 05/20/24 1213

## 2024-05-20 NOTE — ED Triage Notes (Signed)
 Pt reports had head and chest congestion for 10 days. Tried Mucinex and Dayquil. Reports symptoms will get better then come back.
